# Patient Record
Sex: Female | Born: 1990 | Race: Black or African American | Hispanic: No | Marital: Single | Smoking: Never smoker
Health system: Southern US, Community
[De-identification: ages and names within clinical notes are randomized; demographics above are authoritative.]

## PROBLEM LIST (undated history)

## (undated) DIAGNOSIS — E739 Lactose intolerance, unspecified: Secondary | ICD-10-CM

## (undated) DIAGNOSIS — T7840XA Allergy, unspecified, initial encounter: Secondary | ICD-10-CM

## (undated) HISTORY — DX: Allergy, unspecified, initial encounter: T78.40XA

## (undated) HISTORY — DX: Lactose intolerance, unspecified: E73.9

## (undated) HISTORY — PX: HERNIA REPAIR: SHX51

---

## 2011-08-31 ENCOUNTER — Encounter (HOSPITAL_COMMUNITY): Payer: Self-pay | Admitting: Emergency Medicine

## 2011-08-31 ENCOUNTER — Emergency Department (HOSPITAL_COMMUNITY)
Admission: EM | Admit: 2011-08-31 | Discharge: 2011-08-31 | Disposition: A | Discharge status: Home / Self Care | Payer: PRIVATE HEALTH INSURANCE | Attending: Emergency Medicine | Admitting: Emergency Medicine

## 2011-08-31 DIAGNOSIS — N898 Other specified noninflammatory disorders of vagina: Secondary | ICD-10-CM

## 2011-08-31 DIAGNOSIS — R109 Unspecified abdominal pain: Secondary | ICD-10-CM | POA: Insufficient documentation

## 2011-08-31 DIAGNOSIS — N390 Urinary tract infection, site not specified: Secondary | ICD-10-CM

## 2011-08-31 LAB — URINALYSIS, ROUTINE W REFLEX MICROSCOPIC
Bilirubin Urine: NEGATIVE
Glucose, UA: NEGATIVE mg/dL
Hgb urine dipstick: NEGATIVE
Specific Gravity, Urine: 1.027 (ref 1.005–1.030)

## 2011-08-31 LAB — URINE MICROSCOPIC-ADD ON

## 2011-08-31 LAB — PREGNANCY, URINE: Preg Test, Ur: NEGATIVE

## 2011-08-31 MED ORDER — CIPROFLOXACIN HCL 500 MG PO TABS: 500.0000 mg | ORAL_TABLET | Freq: Two times a day (BID) | ORAL | Status: AC

## 2011-08-31 MED ORDER — CIPROFLOXACIN HCL 500 MG PO TABS: 500.0000 mg | ORAL_TABLET | Freq: Two times a day (BID) | ORAL | Status: DC

## 2011-08-31 NOTE — ED Notes (Signed)
Patient refusing pelvic at this time. Dr. Nicanor Alcon aware. Plan to discharge.

## 2011-08-31 NOTE — ED Notes (Signed)
Pt presents with c/o flank pain that started off on her left side and now it hurts on both sides  Pt states she thought she had a UTI about a week ago because her urine was cloudy so she started taking AZO cranberry and although her urine cleared up that's when the pain started

## 2011-08-31 NOTE — Discharge Instructions (Signed)
Urinary Tract Infection, Child A urinary tract infection (UTI) is an infection of the kidneys or bladder. This infection is usually caused by bacteria. CAUSES   Ignoring the need to urinate or holding urine for long periods of time.   Not emptying the bladder completely during urination.   In girls, wiping from back to front after urination or bowel movements.   Using bubble bath, shampoos, or soaps in your child's bath water.   Constipation.   Abnormalities of the kidneys or bladder.  SYMPTOMS   Frequent urination.   Pain or burning sensation with urination.   Urine that smells unusual or is cloudy.   Lower abdominal or back pain.   Bed wetting.   Difficulty urinating.   Blood in the urine.   Fever.   Irritability.  DIAGNOSIS  A UTI is diagnosed with a urine culture. A urine culture detects bacteria and yeast in urine. A sample of urine will need to be collected for a urine culture. TREATMENT  A bladder infection (cystitis) or kidney infection (pyelonephritis) will usually respond to antibiotics. These are medications that kill germs. Your child should take all the medicine given until it is gone. Your child may feel better in a few days, but give ALL MEDICINE. Otherwise, the infection may not respond and become more difficult to treat. Response can generally be expected in 7 to 10 days. HOME CARE INSTRUCTIONS   Give your child lots of fluid to drink.   Avoid caffeine, tea, and carbonated beverages. They tend to irritate the bladder.   Do not use bubble bath, shampoos, or soaps in your child's bath water.   Only give your child over-the-counter or prescription medicines for pain, discomfort, or fever as directed by your child's caregiver.   Do not give aspirin to children. It may cause Reye's syndrome.   It is important that you keep all follow-up appointments. Be sure to tell your caregiver if your child's symptoms continue or return. For repeated infections, your  caregiver may need to evaluate your child's kidneys or bladder.  To prevent further infections:  Encourage your child to empty his or her bladder often and not to hold urine for long periods of time.   After a bowel movement, girls should cleanse from front to back. Use each tissue only once.  SEEK MEDICAL CARE IF:   Your child develops back pain.   Your child has an oral temperature above 102 F (38.9 C).   Your baby is older than 3 months with a rectal temperature of 100.5 F (38.1 C) or higher for more than 1 day.   Your child develops nausea or vomiting.   Your child's symptoms are no better after 3 days of antibiotics.  SEEK IMMEDIATE MEDICAL CARE IF:  Your child has an oral temperature above 102 F (38.9 C).   Your baby is older than 3 months with a rectal temperature of 102 F (38.9 C) or higher.   Your baby is 3 months old or younger with a rectal temperature of 100.4 F (38 C) or higher.  Document Released: 03/10/2005 Document Revised: 05/20/2011 Document Reviewed: 03/21/2009 [employer] Patient Information 2012 [employer].Urine Culture Collection, Female  You will collect a sample of pee (urine) in a cup. Read the instructions below before beginning. If you have any questions, ask the nurse before you begin. Follow the instructions carefully. 1. Wash your hands with soap and water and dry them thoroughly.  2. Open the lid of the cup. Be   careful not to touch the inside.  3. Clean the private (genital) area. 1. Sit over the toilet. Use the fingers of one hand to separate and hold open the folds of the skin in your private area.  2. Clean the pee (urinary) opening and surrounding area with the gauze, wiping from front to back. Throw away the gauze in the trash, not the toilet.  3. Repeat step "b"2 more times.  4. With the folds of skin still separated, pee a small amount into toilet. STOP, then pee into the cup. Fill the cup half way.  5. Put the lid on the cup  tightly.  6. Wash your hands with soap and water.  7. If you were given a label, put the label on the cup.  8. Give the cup to the nurse.  Document Released: 05/13/2008 Document Revised: 05/20/2011 Document Reviewed: 05/13/2008 [employer] Patient Information 2012 [employer]. 

## 2011-08-31 NOTE — ED Provider Notes (Signed)
History     CSN: [id number]  Arrival date & time 08/31/11  0104   First MD Initiated Contact with Patient 08/31/11 0411      No chief complaint on file.   (Consider location/radiation/quality/duration/timing/severity/associated sxs/prior treatment) Patient is a 21 y.o. female presenting with abdominal pain. The history is provided by the patient. No language interpreter was used.  Abdominal Pain The primary symptoms of the illness include abdominal pain and vaginal discharge. The primary symptoms of the illness do not include fever, fatigue, shortness of breath, nausea, vomiting, diarrhea, hematemesis, hematochezia or dysuria. Primary symptoms comment: frequency The current episode started more than 2 days ago. The onset of the illness was gradual. The problem has not changed since onset. The abdominal pain began more than 2 days ago. The pain came on gradually. The abdominal pain has been unchanged since its onset. The abdominal pain is located in the suprapubic region. The abdominal pain does not radiate. The severity of the abdominal pain is 6/10. The abdominal pain is relieved by nothing. Exacerbated by: nothing.  The vaginal discharge was first noticed more than 2 days ago. Vaginal discharge is a new problem. The patient believes that the vaginal discharge is unchanged since it began. The amount of discharge is moderate. The color of the discharge is white. The vaginal discharge is not associated with dysuria.  The patient states that she believes she is currently not pregnant. The patient has not had a change in bowel habit. Symptoms associated with the illness do not include chills. Significant associated medical issues do not include HIV.    History reviewed. No pertinent past medical history.  Past Surgical History  Procedure Date  . Hernia repair     Family History  Problem Relation Age of Onset  . Hypertension Mother   . Diabetes Other     History  Substance Use Topics    . Smoking status: Never Smoker   . Smokeless tobacco: Not on file  . Alcohol Use: No    OB History    Grav Para Term Preterm Abortions TAB SAB Ect Mult Living                  Review of Systems  Constitutional: Negative for fever, chills and fatigue.  HENT: Negative.   Eyes: Negative.   Respiratory: Negative for shortness of breath.   Cardiovascular: Negative.   Gastrointestinal: Positive for abdominal pain. Negative for nausea, vomiting, diarrhea, hematochezia and hematemesis.  Genitourinary: Positive for vaginal discharge. Negative for dysuria.  Musculoskeletal: Negative.   Skin: Positive for color change.  Neurological: Negative.   Hematological: Negative.   Psychiatric/Behavioral: Negative.     Allergies  Review of patient's allergies indicates no known allergies.  Home Medications   Current Outpatient Rx  Name Route Sig Dispense Refill  . IBUPROFEN 400 MG PO TABS Oral Take 400 mg by mouth every 6 (six) hours as needed. For pain relief      BP 118/82  Pulse 77  Temp(Src) 98.9 F (37.2 C) (Oral)  Resp 18  Wt 120 lb (54.432 kg)  SpO2 100%  LMP 08/16/2011  Physical Exam  Constitutional: She is oriented to person, place, and time. She appears well-developed and well-nourished.  HENT:  Head: Normocephalic and atraumatic.  Mouth/Throat: Oropharynx is clear and moist.  Eyes: Conjunctivae are normal. Pupils are equal, round, and reactive to light.  Neck: Normal range of motion. Neck supple.  Cardiovascular: Normal rate and regular rhythm.   Pulmonary/Chest: Effort  normal and breath sounds normal. She has no wheezes. She has no rales.  Abdominal: Soft. Bowel sounds are normal. There is no tenderness. There is no rebound and no guarding.  Musculoskeletal: Normal range of motion.  Neurological: She is alert and oriented to person, place, and time.  Skin: Skin is warm and dry.  Psychiatric: She has a normal mood and affect.    ED Course  Procedures (including  critical care time)  Labs Reviewed  URINALYSIS, ROUTINE W REFLEX MICROSCOPIC - Abnormal; Notable for the following:    APPearance CLOUDY (*)    Leukocytes, UA MODERATE (*)    All other components within normal limits  URINE MICROSCOPIC-ADD ON - Abnormal; Notable for the following:    Squamous Epithelial / LPF MANY (*)    Bacteria, UA MANY (*)    All other components within normal limits  PREGNANCY, URINE  WET PREP, GENITAL  GC/CHLAMYDIA PROBE AMP, GENITAL   No results found.   No diagnosis found.    MDM  With nurse present, patient refuses pelvic examination.  Will treat for UTI        Delando Satter K Dalonte Hardage-Rasch, MD 08/31/11 [phone]

## 2011-08-31 NOTE — ED Notes (Signed)
Patient c/o lower, bilateral flank pain accompanied with dysuria x 2-3 days.

## 2011-10-06 ENCOUNTER — Ambulatory Visit: Payer: PRIVATE HEALTH INSURANCE | Admitting: Gynecology

## 2011-10-11 ENCOUNTER — Ambulatory Visit: Payer: PRIVATE HEALTH INSURANCE | Admitting: Gynecology

## 2011-12-15 ENCOUNTER — Ambulatory Visit: Payer: PRIVATE HEALTH INSURANCE | Admitting: Gynecology

## 2011-12-24 ENCOUNTER — Encounter: Payer: Self-pay | Admitting: Gynecology

## 2011-12-24 ENCOUNTER — Ambulatory Visit (INDEPENDENT_AMBULATORY_CARE_PROVIDER_SITE_OTHER): Payer: PRIVATE HEALTH INSURANCE | Admitting: Gynecology

## 2011-12-24 VITALS — BP 124/82 | Ht 65.0 in | Wt 129.0 lb

## 2011-12-24 DIAGNOSIS — N870 Mild cervical dysplasia: Secondary | ICD-10-CM | POA: Insufficient documentation

## 2011-12-24 MED ORDER — METRONIDAZOLE 500 MG PO TABS: 500.0000 mg | ORAL_TABLET | Freq: Two times a day (BID) | ORAL | Status: AC

## 2011-12-24 NOTE — Progress Notes (Signed)
Hannah Mitchell is an 21 y.o. female. New patient to the practice that presented as a result of 2 prior abnormal Pap smears done in [facility]. Review of her record that she brought with her indicated September 2012 she had low-grade squamous intraepithelial lesion no "testing was done. 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 Period Cycle (Days): 28  Period Duration (Days): 5 Period Pattern: Regular Menstrual Flow: Heavy;Moderate Menstrual Control: Tampon Dysmenorrhea: Moderate Dysmenorrhea Symptoms: Cramping;Nausea;Headache  History reviewed. No pertinent past medical history.  Past Surgical History  Procedure Date  . Hernia repair     Family History  Problem Relation Age of Onset  . Hypertension Mother   . Diabetes Other     Social History:  reports that she has never smoked. She has never used smokeless tobacco. She reports that she drinks alcohol. She reports that she does not use illicit drugs.  Allergies: No Known Allergies   (Not in a hospital admission)  REVIEW OF SYSTEMS: A ROS was performed and pertinent positives and negatives are included in the history.  GENERAL: No fevers or chills. HEENT: No  change in vision, no earache, sore throat or sinus congestion. NECK: No pain or stiffness. CARDIOVASCULAR: No chest pain or pressure. No palpitations. PULMONARY: No shortness of breath, cough or wheeze. GASTROINTESTINAL: No abdominal pain, nausea, vomiting or diarrhea, melena or bright red blood per rectum. GENITOURINARY: No urinary frequency, urgency, hesitancy or dysuria. MUSCULOSKELETAL: No joint or muscle pain, no back pain, no recent trauma. DERMATOLOGIC: No rash, no itching, no lesions. ENDOCRINE: No polyuria, polydipsia, no heat or cold intolerance. No recent change in weight. HEMATOLOGICAL: No anemia or easy bruising or bleeding. NEUROLOGIC: No headache, seizures, numbness, tingling or weakness. PSYCHIATRIC: No depression, no loss of interest in normal activity or change in sleep pattern.     Blood pressure 124/82, height 5\' 5"  (1.651 m), weight 129 lb (58.514 kg), last menstrual period 11/28/2011.  Physical Exam:  HEENT:unremarkable Neck:Supple, midline, no thyroid megaly, no carotid bruits Lungs:  Clear to auscultation no rhonchi's or wheezes Heart:Regular rate and rhythm, no murmurs or gallops Breast Exam: Not examined Abdomen: Soft nontender no rebound or guarding Pelvic:BUS Bartholin urethra Skene was within normal limits Vagina: No lesions or discharge Cervix: Friable cervix  see note above Uterus: Anteverted normal size shape and consistency Adnexa: No masses or tenderness Extremities: No cords, no edema Rectal: Not examined  No results found for this or any previous visit (from the past 24 hour(s)).  No results found.  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 Due to patient's anxiety and apprehension and poor visualization of the endocervical canal with apparent dysplastic tissue being present on visualization as well as on recent cytology it is  recommended she undergo a LEEP cervical conization in a more  relaxed setting for her. The risks benefits and pros and cons of the procedure were discussed as follows:                        Patient was counseled as to the risk of surgery to include the following:  1. Infection (prohylactic antibiotics will be administered)  2. DVT/Pulmonary Embolism (prophylactic pneumo compression stockings will be used)  3.Trauma to internal organs requiring additional surgical procedure to repair any injury to     Internal organs requiring perhaps additional hospitalization days.  4.Hemmorhage requiring transfusion and blood products which carry risks such as             anaphylactic reaction, hepatitis and AIDS  5. The risk of cervical stenosis which could affect fertility in the future as well as cervical incompetence which could lead to premature delivery in the future were discussed.  Patient had received literature information on the procedure scheduled and all her questions were answered. [facility] HMD5:19 PMTD@    Kayleb Warshaw H 12/24/2011, 5:12 PM

## 2011-12-24 NOTE — Patient Instructions (Addendum)
Conization of the Cervix Conization is the cutting (excision) of a cone-shaped portion of the cervix. This procedure is usually done when there is abnormal bleeding from the cervix. It can also be done to evaluate an abnormal Pap smear or if an abnormality is seen on the cervix during an exam. Conization of the cervix is not done during a menstrual period or pregnancy. BEFORE THE PROCEDURE  Do not eat or drink anything for 6 to 8 hours before the procedure, especially if you are going to be given a drug to make you sleep (general anesthetic).   Do not take aspirin or blood thinners for at least a week before the procedure, or as directed.   Arrive at least an hour before the procedure to read and sign the necessary forms.   Arrange for someone to take you home after the procedure.   If you smoke, do not smoke for 2 weeks before the procedure.  LET YOUR CAREGIVER KNOW THE FOLLOWING:  Allergies to food or medications.   All the medications you are taking including over-the-counter and prescription medications, herbs, eyedrops, and creams.   You develop a cold or an infection.   If you are using illegal drugs or drinking too much alcohol.   Your smoking habits.   Previous problems with anesthetics including novocaine.   The possibility of being pregnant.   History of blood clots or other bleeding problems.   Other medical problems.  RISKS AND COMPLICATIONS   Bleeding.   Infection.   Damage to the cervix.   Injury to surrounding organs.   Problems with the anesthesia.  PROCEDURE Conization of the cervix can be done by:  Cold knife. This type cuts out the cervical canal and the transformation zone (where the normal cells end and the abnormal cells begin) with a scalpel.   LEEP (electrocautery). This type is done with a thin wire that can cut and burn (cauterize) the cervical tissue with an electrical current.   Laser treatment. This type cuts and burns (cauterizes) the  tissue of the cervix to prevent bleeding with a laser beam.  You will be given a drug to make you sleep (general anesthetic) for cold knife and laser treatments, and you will be given a numbing medication (local anesthetic) for the LEEP procedure. Conization is usually done using colposcopy. Colposcopy magnifies the cervix to see it more clearly. The tissue removed is examined under a microscope by a doctor (pathologist). The pathologist will provide a report to your caregiver. This will help your caregiver decide if further treatment is necessary. This report will also help your caregiver decide on the best treatment if your results are abnormal.  AFTER THE PROCEDURE  If you had a general anesthetic, you may be groggy for a couple of hours after the procedure.   If you had a local anesthetic, you will be advised to rest at the surgical center or caregiver's office until you are stable and feel ready to go home.   Have someone take you home.   You may have some cramping for a couple of days.   You may have a bloody discharge or light bleeding for 2 to 4 weeks.   You may have a black discharge coming from the vagina. This is from the paste used on the cervix to prevent bleeding. This is normal discharge.  HOME CARE INSTRUCTIONS   Do not drive for 24 hours.   Avoid strenuous activities and exercises for at least 7 -   10 days.   Only take over-the-counter or prescription medicines for pain, discomfort, or fever as directed by your caregiver.   Do not take aspirin. It can cause or aggravate bleeding.   You may resume your usual diet.   Drink 6 to 8 glasses of fluid a day.   Rest and sleep the first 24 to 48 hours.   Take showers instead of baths until your caregiver gives you the okay.   Do not use tampons, douche or have intercourse for 4 weeks, or as advised by your caregiver.   Do not lift anything over 10 pounds (4.5 kg) for at least 7 to 10 days, or as advised by your caregiver.     Take your temperature twice a day, for 4 to 5 days. Write them down.   Do not drink or drive when taking pain medication.   If you develop constipation:   Take a mild laxative with the advice of your caregiver.   Eat bran foods.   Drink a lot of fluids.   Try to have someone with you or available for you the first 24 to 48 hours, especially if you had a general anesthetic.   Make sure you and your family understand everything about your surgery and recovery.   Follow your caregiver's advice regarding follow-up appointments and Pap smears.  SEEK MEDICAL CARE IF:   You develop a rash.   You are dizzy or light-headed.   You feel sick to your stomach (nauseous).   You develop a bad smelling vaginal discharge.   You have an abnormal reaction or allergy to your medication.   You need stronger pain medication.  SEEK IMMEDIATE MEDICAL CARE IF:   You have blood clots or bleeding that is heavier than a normal menstrual period, or you develop bright red bleeding.   An oral temperature above 102 F (38.9 C) develops and persists for several hours.   You have increasing cramps.   You pass out.   You have painful or bloody urine.   You start throwing up (vomiting).   The pain is not relieved with your pain medication.  Not all test results are available during your visit. If your test results are not back during your the visit, make an appointment with your caregiver to find out the results. Do not assume everything is normal if you have not heard from your caregiver or the medical facility. It is important for you to follow up on all of your test results. Document Released: 03/10/2005 Document Revised: 05/20/2011 Document Reviewed: 12/30/2008 [facility] Patient Information 2012 [place], [place].

## 2011-12-27 ENCOUNTER — Telehealth: Payer: Self-pay | Admitting: Gynecology

## 2011-12-27 NOTE — Telephone Encounter (Signed)
I called patient to see about when she would like to schedule her outpatient surgery. She said she needed to talk with her job and see what dates might be good and that she will call me back. I will wait to hear from her.

## 2011-12-28 ENCOUNTER — Telehealth: Payer: Self-pay | Admitting: *Deleted

## 2011-12-28 NOTE — Telephone Encounter (Signed)
Pt asked if okay to take tylenol with flagyl rx given at office visit due to cramping from C&B. Pt informed okay to do this.

## 2012-01-19 ENCOUNTER — Telehealth: Payer: Self-pay | Admitting: Gynecology

## 2012-01-19 NOTE — Telephone Encounter (Signed)
When I called patient 7/15 she said she needed to check work dates and call me back to schedule LEEP.  Since I never heard from her I called her today to follow-up.  She tells me she has decided she is not going to schedule it. She wants to "get a second opinion to see if this is really necessary".  I told her that she should not put this off that she should call the second opinion doctor and get appointment as soon as she can.

## 2012-01-19 NOTE — Telephone Encounter (Signed)
Olegario Messier, could you please find  this patient's pathology report when she had  been here for colposcopy. Thank you

## 2012-01-20 NOTE — Telephone Encounter (Signed)
Patient informed.  She made appointment for Pap R.

## 2012-01-20 NOTE — Telephone Encounter (Signed)
Left message for patient to call.

## 2012-01-20 NOTE — Telephone Encounter (Signed)
She was very uncomfortable during the colposcopic evaluation and this is the reason we were going to do it in the operating room with a possible LEEP procedure. If she like she can return back this week and we can repeat her Pap smear since is almost in 6 months since the last Pap smear to see if the dysplasia is still present.

## 2012-01-20 NOTE — Telephone Encounter (Signed)
I do not see a pathology report on file.  Looking through your office note at time of C&B looks like that you did not take biopsy.  See below.  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 Due to patient's anxiety and apprehension and poor visualization of the endocervical canal with apparent dysplastic tissue being present on visualization as well as on recent cytology it is recommended she undergo a LEEP cervical conization in a more relaxed setting for her. The risks benefits and pros and cons of the procedure were discussed as follows:  Patient was counseled as to the risk of surgery to include the following:  If you still believe you should have pathology report I will have to call about it.

## 2012-01-26 ENCOUNTER — Ambulatory Visit: Payer: PRIVATE HEALTH INSURANCE | Admitting: Gynecology

## 2012-02-02 ENCOUNTER — Ambulatory Visit (INDEPENDENT_AMBULATORY_CARE_PROVIDER_SITE_OTHER): Payer: PRIVATE HEALTH INSURANCE | Admitting: Gynecology

## 2012-02-02 ENCOUNTER — Encounter: Payer: Self-pay | Admitting: Gynecology

## 2012-02-02 ENCOUNTER — Other Ambulatory Visit (HOSPITAL_COMMUNITY)
Admission: RE | Admit: 2012-02-02 | Discharge: 2012-02-02 | Disposition: A | Discharge status: Home / Self Care | Payer: PRIVATE HEALTH INSURANCE | Source: Ambulatory Visit | Attending: Gynecology | Admitting: Gynecology

## 2012-02-02 VITALS — BP 114/68

## 2012-02-02 DIAGNOSIS — Z1151 Encounter for screening for human papillomavirus (HPV): Secondary | ICD-10-CM | POA: Insufficient documentation

## 2012-02-02 DIAGNOSIS — Z01419 Encounter for gynecological examination (general) (routine) without abnormal findings: Secondary | ICD-10-CM | POA: Insufficient documentation

## 2012-02-02 DIAGNOSIS — IMO0002 Reserved for concepts with insufficient information to code with codable children: Secondary | ICD-10-CM

## 2012-02-02 DIAGNOSIS — R87612 Low grade squamous intraepithelial lesion on cytologic smear of cervix (LGSIL): Secondary | ICD-10-CM

## 2012-02-02 NOTE — Progress Notes (Signed)
Patient is a 21 year old who was seen the office on July 12 of the new patient to the practice as a result of 2 abnormal Pap smears in 2012 in 2013 respectively. Her Pap smear history from [place] is as follows:  September 2012 Pap smear demonstrated low-grade squamous intraepithelial lesion March of 2013 Pap smear demonstrated low-grade squamous intraepithelial lesion with atypical endocervical cells  On July 12 patient underwent a colposcopic evaluation see previous office note with pictures from evaluation Due to. Due to patient's anxiety and apprehension it was decided to proceed with a LEEP cervical conization in outpatient setting but patient opted to return to repeat her Pap smear and this is the reason that she's here for today.  Pap smear was repeated today with a vigorous ECC. I've explained to her that if her Pap smear continues to be abnormal that I recommend as I did on last visit to proceed with a LEEP cervical conization to rule out any endocervical disease or higher grade dysplasia. If this Pap smear is normal I would like her to return back in 6 months for colposcopy and Pap smear. On today's Pap smear will check for the HPV 16 and 18 subtype. Patient currently using barrier contraception.

## 2012-08-02 ENCOUNTER — Ambulatory Visit (INDEPENDENT_AMBULATORY_CARE_PROVIDER_SITE_OTHER): Payer: BC Managed Care – PPO | Admitting: Emergency Medicine

## 2012-08-02 ENCOUNTER — Ambulatory Visit: Payer: BC Managed Care – PPO

## 2012-08-02 DIAGNOSIS — M542 Cervicalgia: Secondary | ICD-10-CM

## 2012-08-02 DIAGNOSIS — S139XXA Sprain of joints and ligaments of unspecified parts of neck, initial encounter: Secondary | ICD-10-CM

## 2012-08-02 DIAGNOSIS — M549 Dorsalgia, unspecified: Secondary | ICD-10-CM

## 2012-08-02 DIAGNOSIS — S335XXA Sprain of ligaments of lumbar spine, initial encounter: Secondary | ICD-10-CM

## 2012-08-02 MED ORDER — CYCLOBENZAPRINE HCL 10 MG PO TABS: 10.0000 mg | ORAL_TABLET | Freq: Three times a day (TID) | ORAL | Status: DC | PRN

## 2012-08-02 MED ORDER — NAPROXEN SODIUM 550 MG PO TABS: 550.0000 mg | ORAL_TABLET | Freq: Two times a day (BID) | ORAL | Status: DC

## 2012-08-02 NOTE — Patient Instructions (Addendum)
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
## 2012-11-14 ENCOUNTER — Ambulatory Visit (INDEPENDENT_AMBULATORY_CARE_PROVIDER_SITE_OTHER): Payer: BC Managed Care – PPO | Admitting: Family Medicine

## 2012-11-14 VITALS — BP 118/72 | HR 77 | Temp 98.8°F | Resp 16 | Ht 65.0 in | Wt 133.4 lb

## 2012-11-14 DIAGNOSIS — L509 Urticaria, unspecified: Secondary | ICD-10-CM

## 2012-11-14 DIAGNOSIS — T781XXA Other adverse food reactions, not elsewhere classified, initial encounter: Secondary | ICD-10-CM

## 2012-11-14 MED ORDER — HYDROXYZINE HCL 10 MG PO TABS: 10.0000 mg | ORAL_TABLET | Freq: Three times a day (TID) | ORAL | Status: AC | PRN

## 2012-11-14 NOTE — Progress Notes (Signed)
[facility]:  Office Visit  Chief Complaint:  Chief Complaint  Patient presents with  . Urticaria    x 3 days     HPI: Hannah Mitchell is a 22 y.o. female who complains of  3 day history of hives after she ate chicken and waffles from [employer] chicken and waffles with greens. No food allergies befores. Started on face and then on arms, chest, and thighs. Has taken zyrtec 10 mg twice and Benadryl. She has sensitivities due to allergies and lactose intolerance . Tried Benadryl then stopped and would get worse.  No SOB, CP, voice changes, throat swelling  Past Medical History  Diagnosis Date  . Lactose intolerance   . Allergy    Past Surgical History  Procedure Laterality Date  . Hernia repair     History   Social History  . Marital Status: Single    Spouse Name: N/A    Number of Children: N/A  . Years of Education: N/A   Social History Main Topics  . Smoking status: Never Smoker   . Smokeless tobacco: Never Used  . Alcohol Use: Yes     Comment: OCC  . Drug Use: No  . Sexually Active: Yes    Birth Control/ Protection: Condom   Other Topics Concern  . None   Social History Narrative  . None   Family History  Problem Relation Age of Onset  . Hypertension Mother   . Diabetes Other    No Known Allergies Prior to Admission medications   Not on File     ROS: The patient denies fevers, chills, night sweats, unintentional weight loss, chest pain, palpitations, wheezing, dyspnea on exertion, nausea, vomiting, abdominal pain, dysuria, hematuria, melena, numbness, weakness, or tingling.   All other systems have been reviewed and were otherwise negative with the exception of those mentioned in the HPI and as above.    PHYSICAL EXAM: Filed Vitals:   11/14/12 1649  BP: 118/72  Pulse: 77  Temp: 98.8 F (37.1 C)  Resp: 16   Filed Vitals:   11/14/12 1649  Height: 5\' 5"  (1.651 m)  Weight: 133 lb 6.4 oz (60.51 kg)   Body mass index is 22.2  kg/(m^2).  General: Alert, no acute distress HEENT:  Normocephalic, atraumatic, oropharynx patent.  Cardiovascular:  Regular rate and rhythm, no rubs murmurs or gallops.  No Carotid bruits, radial pulse intact. No pedal edema.  Respiratory: Clear to auscultation bilaterally.  No wheezes, rales, or rhonchi.  No cyanosis, no use of accessory musculature GI: No organomegaly, abdomen is soft and non-tender, positive bowel sounds.  No masses. Skin: + urticarial rashes diffues Neurologic: Facial musculature symmetric. Psychiatric: Patient is appropriate throughout our interaction. Lymphatic: No cervical lymphadenopathy Musculoskeletal: Gait intact.   LABS: Results for orders placed during the hospital encounter of 08/31/11  URINALYSIS, ROUTINE W REFLEX MICROSCOPIC      Result Value Range   Color, Urine YELLOW  YELLOW   APPearance CLOUDY (*) CLEAR   Specific Gravity, Urine 1.027  1.005 - 1.030   pH 6.0  5.0 - 8.0   Glucose, UA NEGATIVE  NEGATIVE mg/dL   Hgb urine dipstick NEGATIVE  NEGATIVE   Bilirubin Urine NEGATIVE  NEGATIVE   Ketones, ur NEGATIVE  NEGATIVE mg/dL   Protein, ur NEGATIVE  NEGATIVE mg/dL   Urobilinogen, UA 0.2  0.0 - 1.0 mg/dL   Nitrite NEGATIVE  NEGATIVE   Leukocytes, UA MODERATE (*) NEGATIVE  PREGNANCY, URINE  Result Value Range   Preg Test, Ur NEGATIVE  NEGATIVE  URINE MICROSCOPIC-ADD ON      Result Value Range   Squamous Epithelial / LPF MANY (*) RARE   WBC, UA 7-10  <3 WBC/hpf   RBC / HPF 0-2  <3 RBC/hpf   Bacteria, UA MANY (*) RARE   Urine-Other MUCOUS PRESENT       EKG/XRAY:   Primary read interpreted by Dr. Conley Rolls at [facility].   ASSESSMENT/PLAN: Encounter Diagnoses  Name Primary?  . Hives Yes  . Food allergy, initial encounter    Rx Atarax She has never tried steroids before and not sure if she wants to  She will call me if scheduled Atarax does not help, consider steroid pack at that time Gross sideeffects, risk and benefits, and alternatives of  medications d/w patient. Patient is aware that all medications have potential sideeffects and we are unable to predict every sideeffect or drug-drug interaction that may occur. F/u prn    Cullin Dishman PHUONG, DO 11/14/2012 6:38 PM

## 2012-11-14 NOTE — Patient Instructions (Signed)
Hives Hives are itchy, red, swollen areas of the skin. They can vary in size and location on your body. Hives can come and go for hours or several days (acute hives) or for several weeks (chronic hives). Hives do not spread from person to person (noncontagious). They may get worse with scratching, exercise, and emotional stress. CAUSES   Allergic reaction to food, additives, or drugs.  Infections, including the common cold.  Illness, such as vasculitis, lupus, or thyroid disease.  Exposure to sunlight, heat, or cold.  Exercise.  Stress.  Contact with chemicals. SYMPTOMS   Red or white swollen patches on the skin. The patches may change size, shape, and location quickly and repeatedly.  Itching.  Swelling of the hands, feet, and face. This may occur if hives develop deeper in the skin. DIAGNOSIS  Your caregiver can usually tell what is wrong by performing a physical exam. Skin or blood tests may also be done to determine the cause of your hives. In some cases, the cause cannot be determined. TREATMENT  Mild cases usually get better with medicines such as antihistamines. Severe cases may require an emergency epinephrine injection. If the cause of your hives is known, treatment includes avoiding that trigger.  HOME CARE INSTRUCTIONS   Avoid causes that trigger your hives.  Take antihistamines as directed by your caregiver to reduce the severity of your hives. Non-sedating or low-sedating antihistamines are usually recommended. Do not drive while taking an antihistamine.  Take any other medicines prescribed for itching as directed by your caregiver.  Wear loose-fitting clothing.  Keep all follow-up appointments as directed by your caregiver. SEEK MEDICAL CARE IF:   You have persistent or severe itching that is not relieved with medicine.  You have painful or swollen joints. SEEK IMMEDIATE MEDICAL CARE IF:   You have a fever.  Your tongue or lips are swollen.  You have  trouble breathing or swallowing.  You feel tightness in the throat or chest.  You have abdominal pain. These problems may be the first sign of a life-threatening allergic reaction. Call your local emergency services (911 in [place] MAKE SURE YOU:   Understand these instructions.  Will watch your condition.  Will get help right away if you are not doing well or get worse. Document Released: 05/31/2005 Document Revised: 11/30/2011 Document Reviewed: 08/24/2011 [employer] Patient Information 2014 [employer].  

## 2013-11-27 ENCOUNTER — Other Ambulatory Visit: Payer: Self-pay | Admitting: Family Medicine

## 2013-11-28 ENCOUNTER — Other Ambulatory Visit: Payer: Self-pay | Admitting: Family Medicine

## 2013-12-01 ENCOUNTER — Telehealth: Payer: Self-pay

## 2013-12-01 NOTE — Telephone Encounter (Signed)
NEEDS A REFILL OF ATARAX/VISTARIL PLEASE!

## 2013-12-01 NOTE — Telephone Encounter (Signed)
Dr Conley RollsLe states in OV note to have pt start steroids if Vistaril is not working. Pt needs refill at this point. Still has hives.

## 2013-12-02 NOTE — Telephone Encounter (Signed)
If she still have hives she needs an OV.

## 2013-12-03 NOTE — Telephone Encounter (Signed)
Lm advising pt to RTC 

## 2014-08-28 IMAGING — CR DG LUMBAR SPINE COMPLETE 4+V
5 series · 5 of 5 positions shown · non-contrast
Comparison: None.

CLINICAL DATA: Back pain

LUMBAR SPINE - COMPLETE 4+ VIEW

[AP]
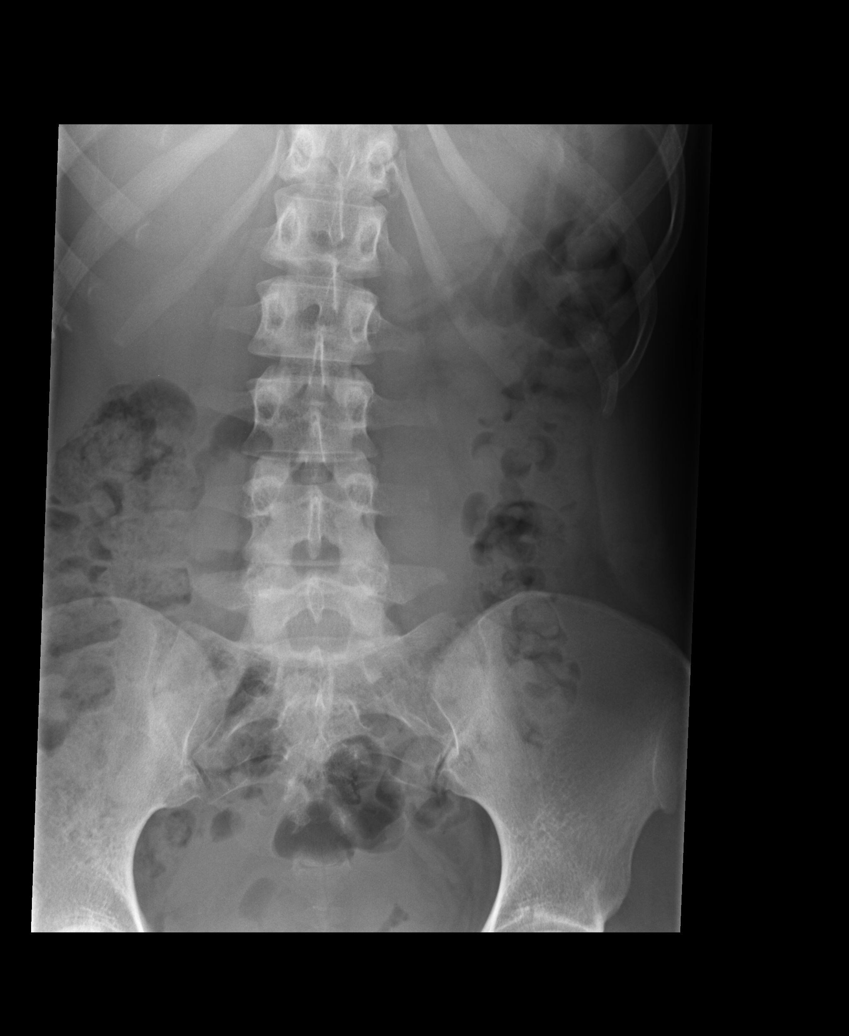

[rpo]
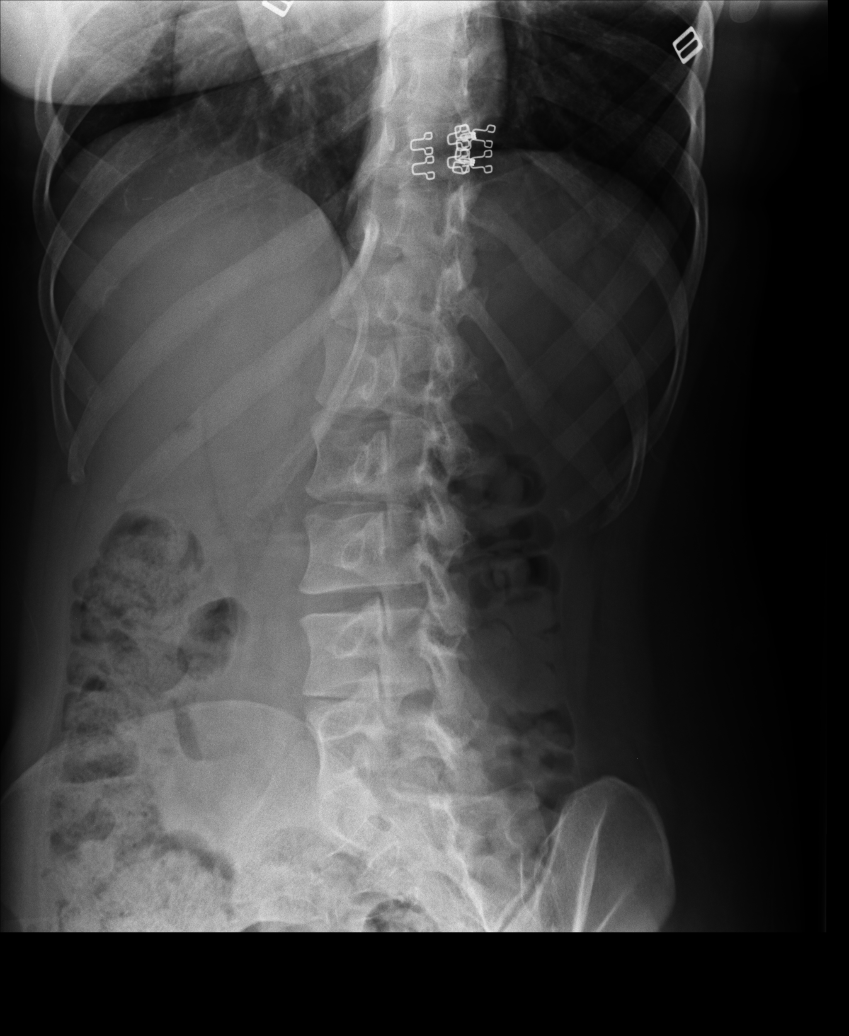

[lpo]
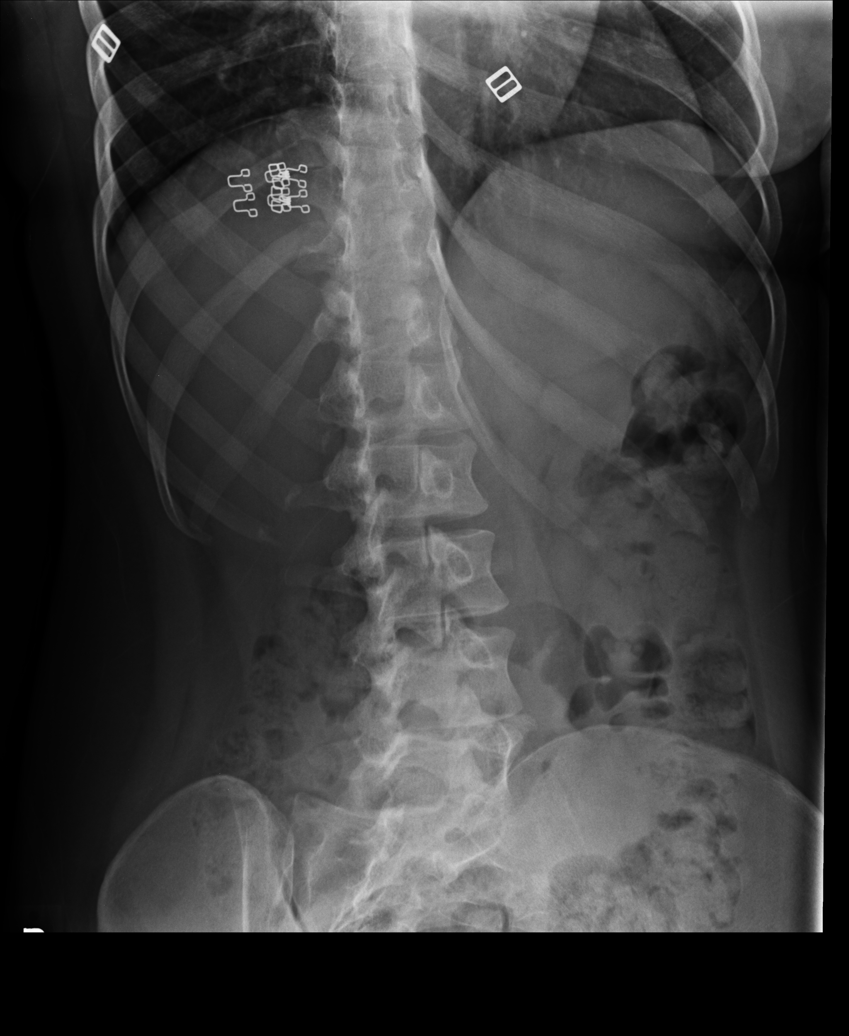

[lateral]
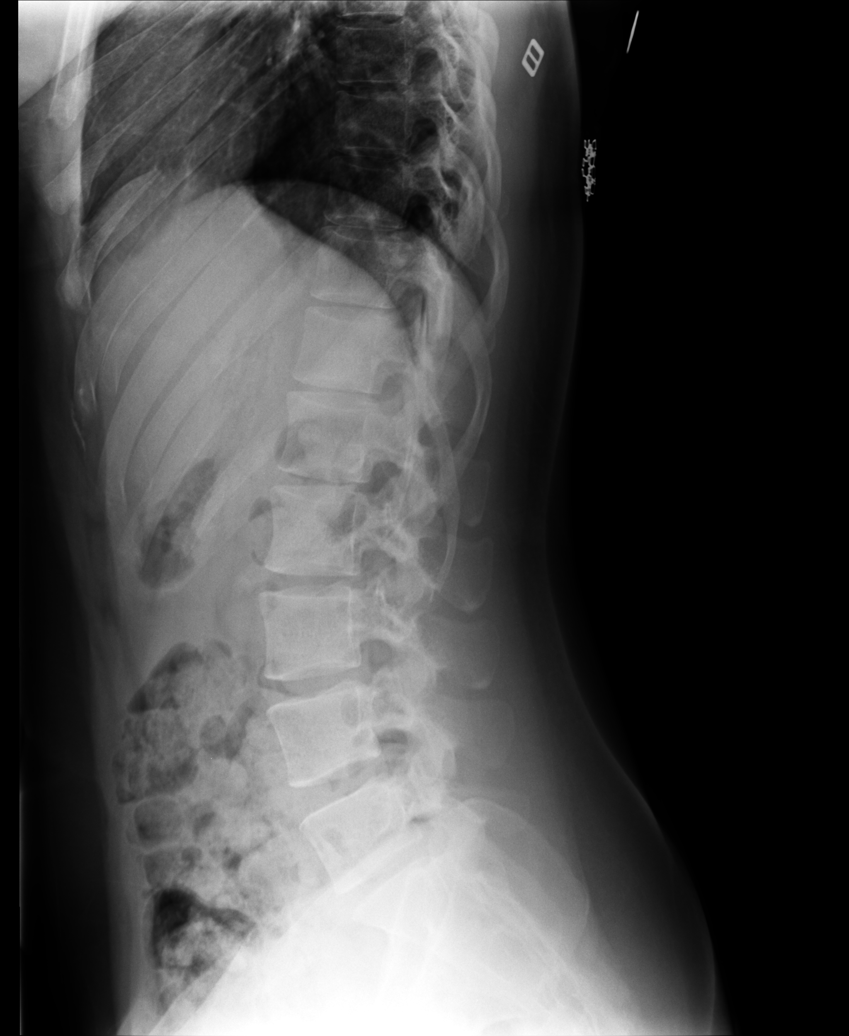

[l5 s1]
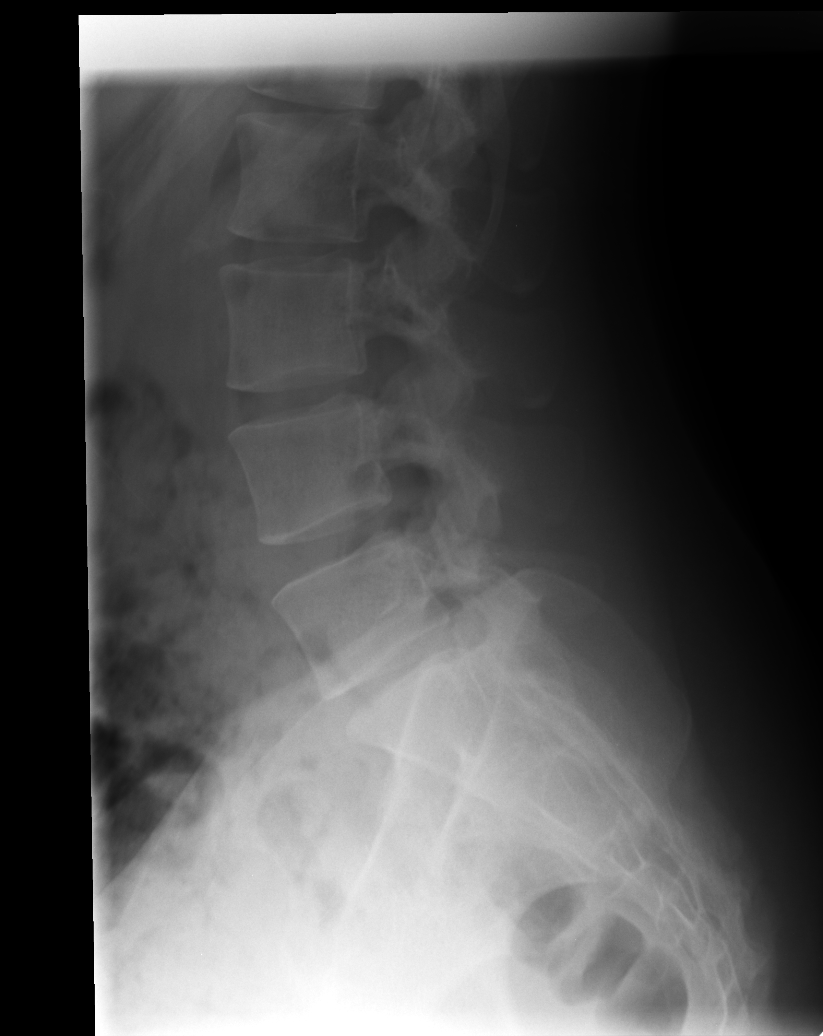

[5 of 5 positions shown; findings below may reference images not displayed]

FINDINGS: There is a mild dextroscoliosis with the apex at L2-L3.

No fracture.  No spondylolisthesis.  No degenerative changes.
Normal surrounding soft tissues.
IMPRESSION: Mild dextroscoliosis.  No other abnormalities.

Clinically significant discrepancy from primary report, if
provided: None
